# Patient Record
Sex: Male | Born: 2000 | Race: Black or African American | Hispanic: No | Marital: Single
Health system: Southern US, Community
[De-identification: ages and names within clinical notes are randomized; demographics above are authoritative.]

---

## 2019-08-13 ENCOUNTER — Emergency Department (HOSPITAL_COMMUNITY): Payer: Self-pay

## 2019-08-13 ENCOUNTER — Other Ambulatory Visit: Payer: Self-pay

## 2019-08-13 ENCOUNTER — Emergency Department (HOSPITAL_COMMUNITY)
Admission: EM | Admit: 2019-08-13 | Discharge: 2019-08-13 | Disposition: A | Discharge status: Home / Self Care | Payer: Self-pay | Attending: Emergency Medicine | Admitting: Emergency Medicine

## 2019-08-13 DIAGNOSIS — X500XXA Overexertion from strenuous movement or load, initial encounter: Secondary | ICD-10-CM | POA: Insufficient documentation

## 2019-08-13 DIAGNOSIS — Y999 Unspecified external cause status: Secondary | ICD-10-CM | POA: Insufficient documentation

## 2019-08-13 DIAGNOSIS — Y9367 Activity, basketball: Secondary | ICD-10-CM | POA: Insufficient documentation

## 2019-08-13 DIAGNOSIS — S93402A Sprain of unspecified ligament of left ankle, initial encounter: Secondary | ICD-10-CM | POA: Insufficient documentation

## 2019-08-13 DIAGNOSIS — Y929 Unspecified place or not applicable: Secondary | ICD-10-CM | POA: Insufficient documentation

## 2019-08-13 NOTE — ED Notes (Signed)
Pt verbalized understanding of discharge paperwork and follow-up care.  °

## 2019-08-13 NOTE — Progress Notes (Signed)
Orthopedic Tech Progress Note Patient Details:  [facility] 2001/08/28 [id number]  Ortho Devices Type of Ortho Device: CAM walker Ortho Device/Splint Location: LLE Ortho Device/Splint Interventions: Adjustment, Application, Ordered   Post Interventions Patient Tolerated: Ambulated well, Well Instructions Provided: Poper ambulation with device, Care of device, Adjustment of device   Janit Pagan 08/13/2019, 3:35 PM

## 2019-08-13 NOTE — ED Provider Notes (Signed)
[facility] EMERGENCY DEPARTMENT Provider Note   CSN: [id number] Arrival date & time: 08/13/19  1356     History   Chief Complaint Chief Complaint  Patient presents with  . Ankle Injury    HPI Glen Ford is a 18 y.o. male.     HPI   18 year old male presents today with complaints of ankle pain.  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  He notes he landed on his left lateral ankle yesterday.  He notes pain with ambulation and movement.  He notes a history of previous ankle fracture that required casting no operative management.  Patient notes he was seen by his trainer who diagnosed him with ankle sprain.  Patient bought a ankle wrap yesterday but notes it is not improving his symptoms.  No past medical history on file.  There are no active problems to display for this patient.         Home Medications    Prior to Admission medications   Not on File    Family History No family history on file.  Social History Social History   Tobacco Use  . Smoking status: Not on file  Substance Use Topics  . Alcohol use: Not on file  . Drug use: Not on file     Allergies   Patient has no known allergies.   Review of Systems Review of Systems  All other systems reviewed and are negative.    Physical Exam Updated Vital Signs BP 124/69 (BP Location: Left Arm)   Pulse (!) 49   Temp 98.3 F (36.8 C) (Oral)   Resp 16   SpO2 98%   Physical Exam Vitals signs and nursing note reviewed.  Constitutional:      Appearance: He is well-developed.  HENT:     Head: Normocephalic and atraumatic.  Eyes:     General: No scleral icterus.       Right eye: No discharge.        Left eye: No discharge.     Conjunctiva/sclera: Conjunctivae normal.     Pupils: Pupils are equal, round, and reactive to light.  Neck:     Musculoskeletal: Normal range of motion.     Vascular: No JVD.     Trachea: No tracheal deviation.  Pulmonary:   Effort: Pulmonary effort is normal.     Breath sounds: No stridor.  Musculoskeletal:     Comments: Left ankle with swelling or redness, TTP of lateral malleolus, no significant laxity noted the ankle some pain with inversion  Neurological:     Mental Status: He is alert and oriented to person, place, and time.     Coordination: Coordination normal.  Psychiatric:        Behavior: Behavior normal.        Thought Content: Thought content normal.        Judgment: Judgment normal.     ED Treatments / Results  Labs (all labs ordered are listed, but only abnormal results are displayed) Labs Reviewed - No data to display  EKG None  Radiology Dg Ankle Complete Left  Result Date: 08/13/2019 CLINICAL DATA:  Pt here for evaluation of L ankle injury yesterday while playing basketball. Pt has previously broken this ankle EXAM: LEFT ANKLE COMPLETE - 3+ VIEW COMPARISON:  None. FINDINGS: There is no evidence of fracture, dislocation, or joint effusion. There is no evidence of arthropathy or other focal bone abnormality. Soft tissues are unremarkable. IMPRESSION: Negative left ankle radiographs.  Electronically Signed   By: Emmaline Kluver M.D.   On: 08/13/2019 14:27    Procedures Procedures (including critical care time)  Medications Ordered in ED Medications - No data to display   Initial Impression / Assessment and Plan / ED Course  I have reviewed the triage vital signs and the nursing notes.  Pertinent labs & imaging results that were available during my care of the patient were reviewed by me and considered in my medical decision making (see chart for details).        Labs:   Imaging:  Consults:  Therapeutics:  Discharge Meds:   Assessment/Plan: 18 year old male with ankle sprain no significant laxity.  We will place him CAM Walker discharged with outpatient follow-up.  He verbalized understanding and agreement to this plan.     Final Clinical Impressions(s) / ED  Diagnoses   Final diagnoses:  Sprain of left ankle, unspecified ligament, initial encounter    ED Discharge Orders    None       Rosalio Loud 08/13/19 1517    Gerhard Munch, MD 08/13/19 1553

## 2019-08-13 NOTE — ED Triage Notes (Signed)
Pt here for evaluation of L ankle injury yesterday while playing basketball. Pt has previously broken this ankle. No OTC meds PTA. Pt ambulatory.

## 2019-08-13 NOTE — Discharge Instructions (Addendum)
Please read attached information. If you experience any new or worsening signs or symptoms please return to the emergency room for evaluation. Please follow-up with your primary care provider or specialist as discussed.  °

## 2019-08-15 ENCOUNTER — Emergency Department (HOSPITAL_COMMUNITY)
Admission: EM | Admit: 2019-08-15 | Discharge: 2019-08-15 | Disposition: A | Discharge status: Home / Self Care | Payer: Self-pay | Attending: Emergency Medicine | Admitting: Emergency Medicine

## 2019-08-15 DIAGNOSIS — Z789 Other specified health status: Secondary | ICD-10-CM | POA: Insufficient documentation

## 2019-08-15 DIAGNOSIS — X500XXD Overexertion from strenuous movement or load, subsequent encounter: Secondary | ICD-10-CM | POA: Insufficient documentation

## 2019-08-15 DIAGNOSIS — S93402A Sprain of unspecified ligament of left ankle, initial encounter: Secondary | ICD-10-CM

## 2019-08-15 DIAGNOSIS — S93402D Sprain of unspecified ligament of left ankle, subsequent encounter: Secondary | ICD-10-CM | POA: Insufficient documentation

## 2019-08-15 NOTE — ED Provider Notes (Signed)
[facility] Provider Note   CSN: [id number] Arrival date & time: 08/15/19  1151     History   Chief Complaint No chief complaint on file.   HPI [facility] is a 18 y.o. male.     HPI    18 year old male presents today for return to work note.  Patient had an ankle sprain which she notes is improving.  He was originally seen in the emergency room for this.      No past medical history on file.  There are no active problems to display for this patient.       Home Medications    Prior to Admission medications   Not on File    Family History No family history on file.  Social History Social History   Tobacco Use  . Smoking status: Not on file  Substance Use Topics  . Alcohol use: Not on file  . Drug use: Not on file     Allergies   Patient has no known allergies.   Review of Systems Review of Systems  All other systems reviewed and are negative.    Physical Exam Updated Vital Signs BP 117/69 (BP Location: Right Arm)   Pulse 63   Temp 98.5 F (36.9 C) (Oral)   Resp 17   SpO2 98%   Physical Exam Vitals signs and nursing note reviewed.  Constitutional:      Appearance: He is well-developed.  HENT:     Head: Normocephalic and atraumatic.  Eyes:     General: No scleral icterus.       Right eye: No discharge.        Left eye: No discharge.     Conjunctiva/sclera: Conjunctivae normal.     Pupils: Pupils are equal, round, and reactive to light.  Neck:     Musculoskeletal: Normal range of motion.     Vascular: No JVD.     Trachea: No tracheal deviation.  Pulmonary:     Effort: Pulmonary effort is normal.     Breath sounds: No stridor.  Musculoskeletal:     Comments: Ankle with brace on  Neurological:     Mental Status: He is alert and oriented to person, place, and time.     Coordination: Coordination normal.  Psychiatric:        Behavior: Behavior normal.        Thought Content: Thought  content normal.        Judgment: Judgment normal.      ED Treatments / Results  Labs (all labs ordered are listed, but only abnormal results are displayed) Labs Reviewed - No data to display  EKG None  Radiology Dg Ankle Complete Left  Result Date: 08/13/2019 CLINICAL DATA:  Pt here for evaluation of L ankle injury yesterday while playing basketball. Pt has previously broken this ankle EXAM: LEFT ANKLE COMPLETE - 3+ VIEW COMPARISON:  None. FINDINGS: There is no evidence of fracture, dislocation, or joint effusion. There is no evidence of arthropathy or other focal bone abnormality. Soft tissues are unremarkable. IMPRESSION: Negative left ankle radiographs. Electronically Signed   By: Audie Pinto M.D.   On: 08/13/2019 14:27    Procedures Procedures (including critical care time)  Medications Ordered in ED Medications - No data to display   Initial Impression / Assessment and Plan / ED Course  I have reviewed the triage vital signs and the nursing notes.  Pertinent labs & imaging results that were available during  my care of the patient were reviewed by me and considered in my medical decision making (see chart for details).        Patient here for work note so he may return to work.  I saw him the other day he had a very minimal ankle sprain, if he feels comfortable going to work I see no reason to keep him out of work.  Final Clinical Impressions(s) / ED Diagnoses   Final diagnoses:  Sprain of left ankle, unspecified ligament, initial encounter    ED Discharge Orders    None       Rosalio Loud 08/15/19 1341    Benjiman Core, MD 08/15/19 1547

## 2019-08-15 NOTE — ED Triage Notes (Addendum)
Pt needs note from provider for work that he is safe to return to work. Ankle injury previously.

## 2019-08-15 NOTE — Discharge Instructions (Signed)
Please read attached information. If you experience any new or worsening signs or symptoms please return to the emergency room for evaluation. Please follow-up with your primary care provider or specialist as discussed.  °

## 2019-10-12 ENCOUNTER — Other Ambulatory Visit: Payer: Self-pay

## 2019-10-12 DIAGNOSIS — Z20822 Contact with and (suspected) exposure to covid-19: Secondary | ICD-10-CM

## 2019-10-14 LAB — NOVEL CORONAVIRUS, NAA: SARS-CoV-2, NAA: NOT DETECTED

## 2021-05-18 IMAGING — DX DG ANKLE COMPLETE 3+V*L*
3 series · 3 of 3 positions shown · non-contrast
Comparison: None.

CLINICAL DATA: Pt here for evaluation of L ankle injury yesterday
while playing basketball. Pt has previously broken this ankle

EXAM:
LEFT ANKLE COMPLETE - 3+ VIEW

[ankle ap]
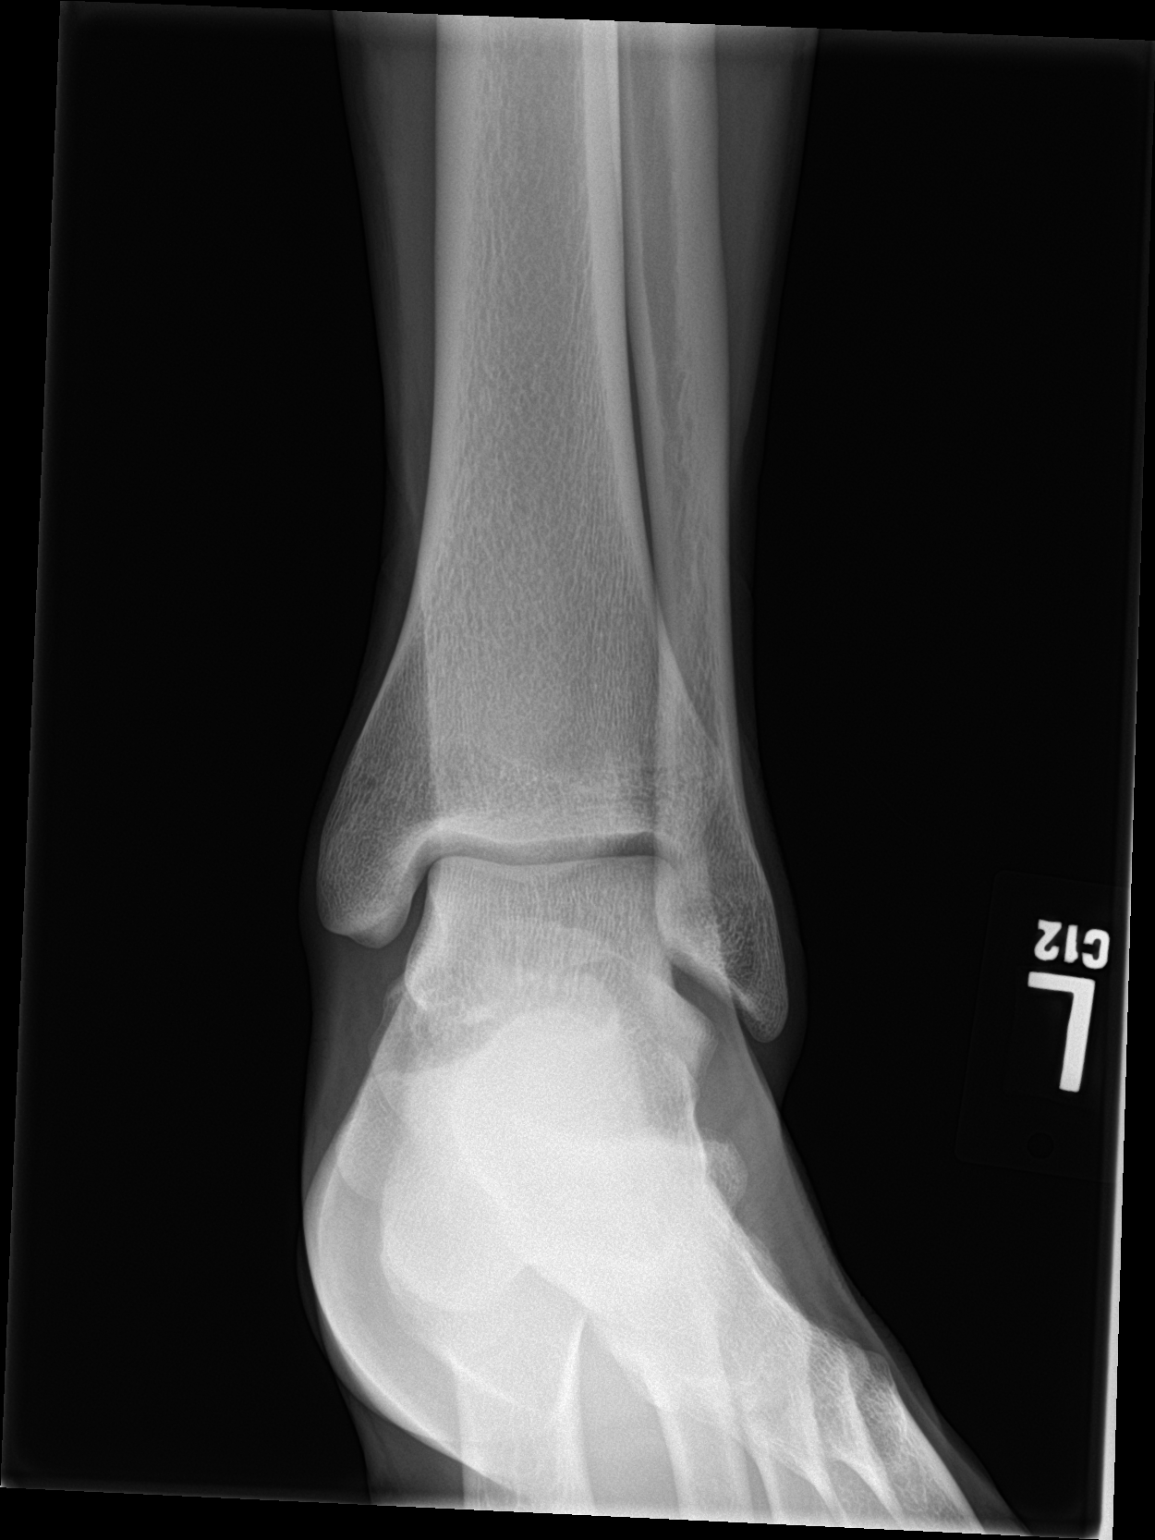

[ankle obl]
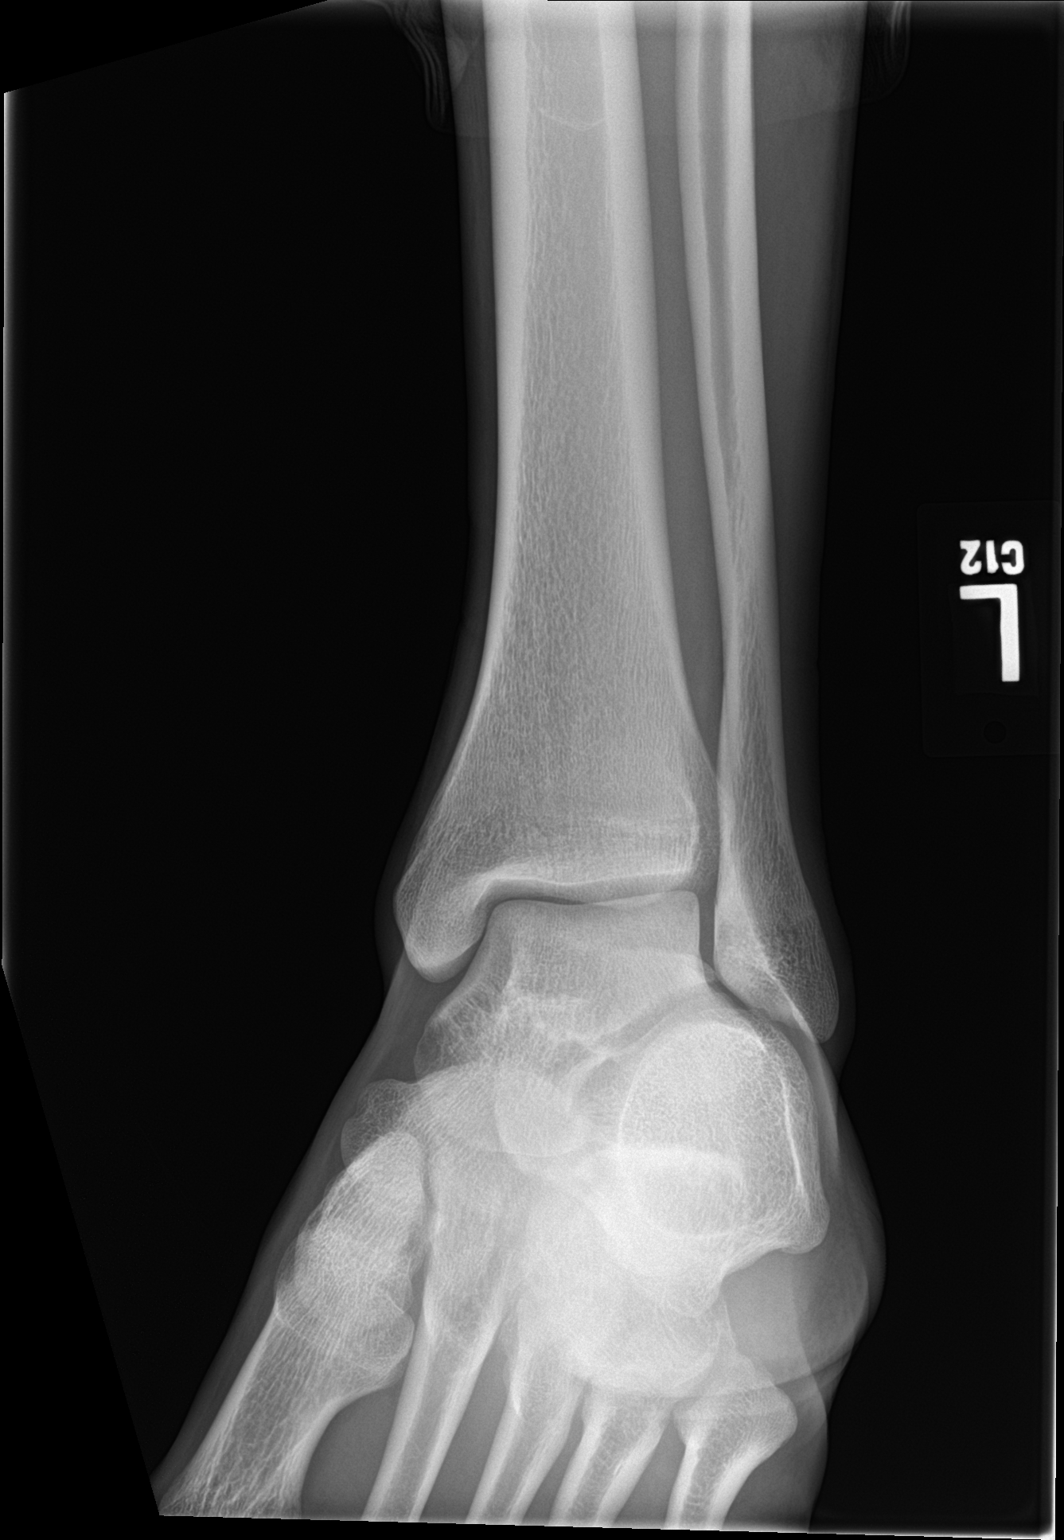

[ankle lat]
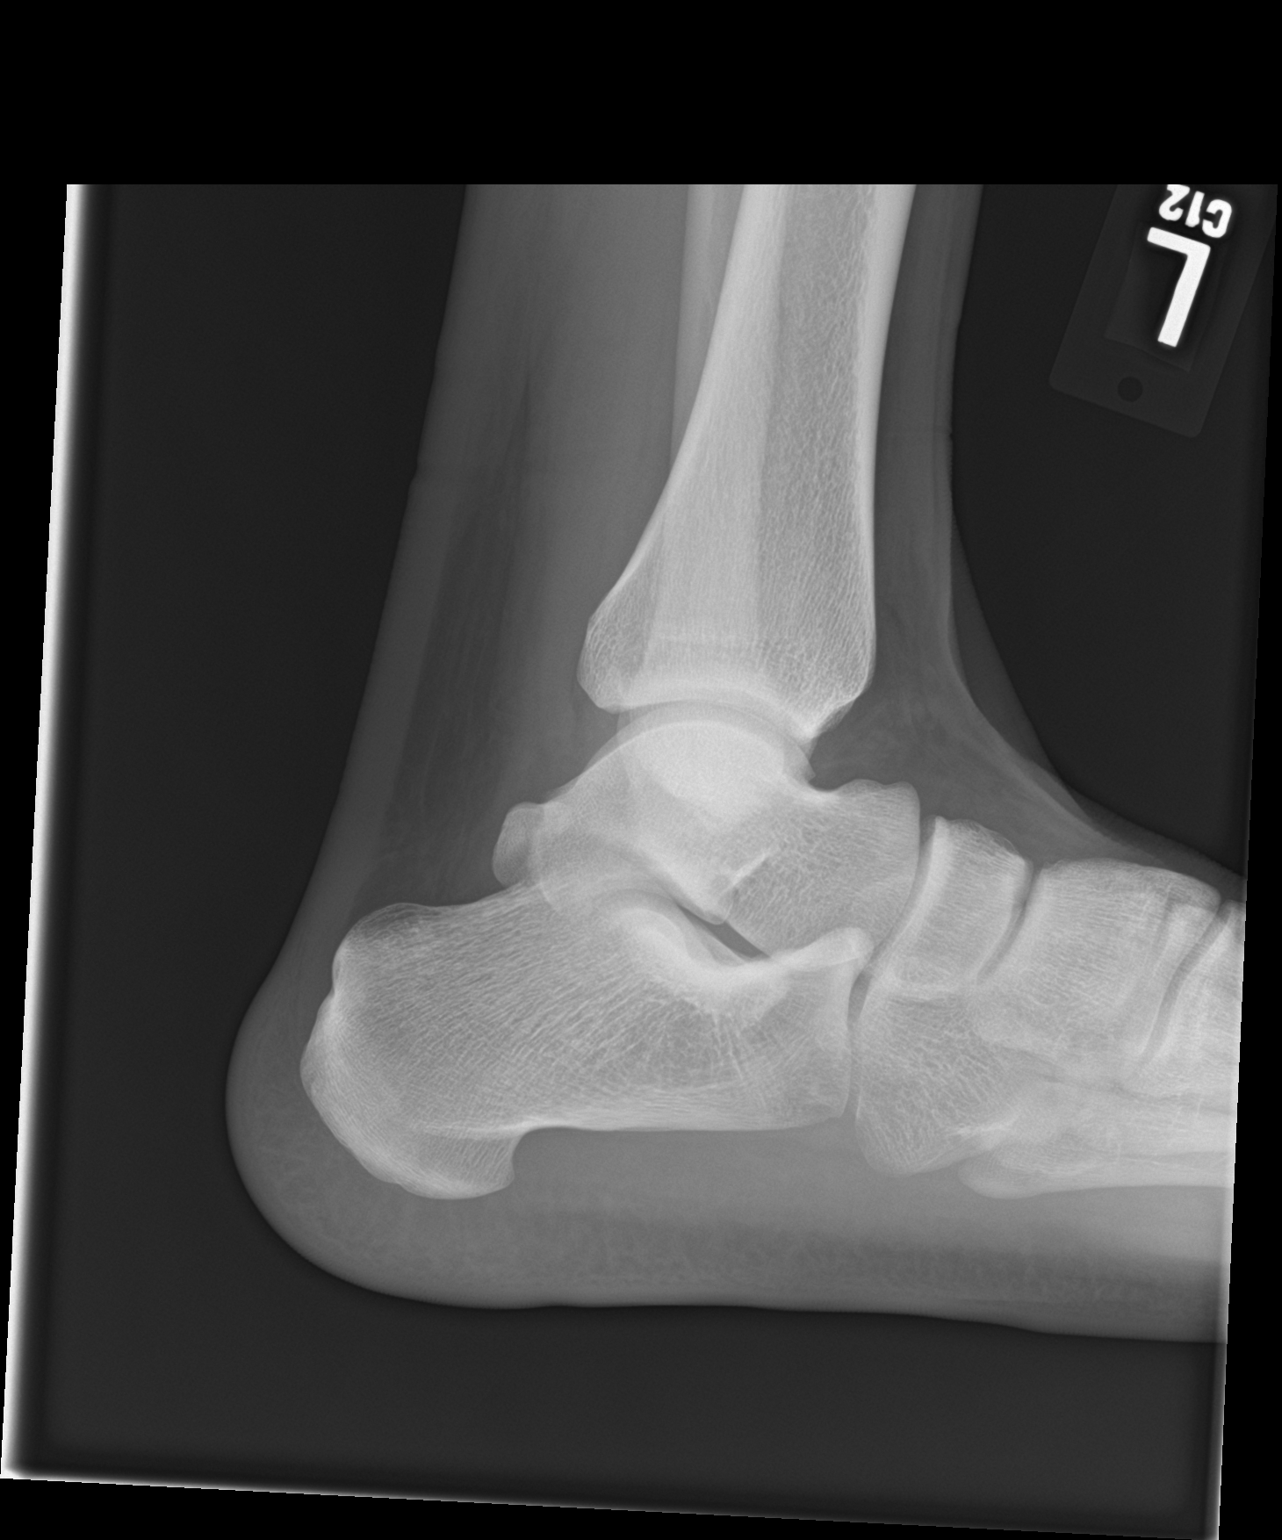

[3 of 3 positions shown; findings below may reference images not displayed]

FINDINGS: There is no evidence of fracture, dislocation, or joint effusion.
There is no evidence of arthropathy or other focal bone abnormality.
Soft tissues are unremarkable.
IMPRESSION: Negative left ankle radiographs.
